# Patient Record
Sex: Male | Born: 1970 | Race: Black or African American | Hispanic: No | Marital: Married | Smoking: Never smoker
Health system: Southern US, Community
[De-identification: ages and names within clinical notes are randomized; demographics above are authoritative.]

## PROBLEM LIST (undated history)

## (undated) DIAGNOSIS — N189 Chronic kidney disease, unspecified: Secondary | ICD-10-CM

## (undated) DIAGNOSIS — I10 Essential (primary) hypertension: Secondary | ICD-10-CM

## (undated) HISTORY — PX: PROSTATECTOMY: SHX69

---

## 2019-03-23 ENCOUNTER — Emergency Department (HOSPITAL_BASED_OUTPATIENT_CLINIC_OR_DEPARTMENT_OTHER)
Admission: EM | Admit: 2019-03-23 | Discharge: 2019-03-23 | Disposition: A | Discharge status: Home / Self Care | Payer: 59 | Attending: Emergency Medicine | Admitting: Emergency Medicine

## 2019-03-23 ENCOUNTER — Encounter (HOSPITAL_BASED_OUTPATIENT_CLINIC_OR_DEPARTMENT_OTHER): Payer: Self-pay

## 2019-03-23 ENCOUNTER — Other Ambulatory Visit: Payer: Self-pay

## 2019-03-23 ENCOUNTER — Emergency Department (HOSPITAL_BASED_OUTPATIENT_CLINIC_OR_DEPARTMENT_OTHER): Payer: 59

## 2019-03-23 DIAGNOSIS — I10 Essential (primary) hypertension: Secondary | ICD-10-CM | POA: Diagnosis not present

## 2019-03-23 DIAGNOSIS — R002 Palpitations: Secondary | ICD-10-CM | POA: Insufficient documentation

## 2019-03-23 DIAGNOSIS — Z20828 Contact with and (suspected) exposure to other viral communicable diseases: Secondary | ICD-10-CM | POA: Insufficient documentation

## 2019-03-23 DIAGNOSIS — R0789 Other chest pain: Secondary | ICD-10-CM | POA: Diagnosis present

## 2019-03-23 DIAGNOSIS — R42 Dizziness and giddiness: Secondary | ICD-10-CM

## 2019-03-23 LAB — CBC WITH DIFFERENTIAL/PLATELET
Abs Immature Granulocytes: 0.03 10*3/uL (ref 0.00–0.07)
Basophils Absolute: 0.1 10*3/uL (ref 0.0–0.1)
Basophils Relative: 1 %
Eosinophils Absolute: 0.2 10*3/uL (ref 0.0–0.5)
Eosinophils Relative: 2 %
HCT: 52.3 % — ABNORMAL HIGH (ref 39.0–52.0)
Hemoglobin: 17 g/dL (ref 13.0–17.0)
Immature Granulocytes: 0 %
Lymphocytes Relative: 24 %
Lymphs Abs: 2.6 10*3/uL (ref 0.7–4.0)
MCH: 31.7 pg (ref 26.0–34.0)
MCHC: 32.5 g/dL (ref 30.0–36.0)
MCV: 97.6 fL (ref 80.0–100.0)
Monocytes Absolute: 1.3 10*3/uL — ABNORMAL HIGH (ref 0.1–1.0)
Monocytes Relative: 12 %
Neutro Abs: 6.5 10*3/uL (ref 1.7–7.7)
Neutrophils Relative %: 61 %
Platelets: 344 10*3/uL (ref 150–400)
RBC: 5.36 MIL/uL (ref 4.22–5.81)
RDW: 11.8 % (ref 11.5–15.5)
WBC: 10.7 10*3/uL — ABNORMAL HIGH (ref 4.0–10.5)
nRBC: 0 % (ref 0.0–0.2)

## 2019-03-23 LAB — MAGNESIUM: Magnesium: 2 mg/dL (ref 1.7–2.4)

## 2019-03-23 LAB — BASIC METABOLIC PANEL
Anion gap: 12 (ref 5–15)
BUN: 11 mg/dL (ref 6–20)
CO2: 24 mmol/L (ref 22–32)
Calcium: 9.2 mg/dL (ref 8.9–10.3)
Chloride: 104 mmol/L (ref 98–111)
Creatinine, Ser: 0.98 mg/dL (ref 0.61–1.24)
GFR calc Af Amer: >60 mL/min (ref >60)
GFR calc non Af Amer: >60 mL/min (ref >60)
Glucose, Bld: 126 mg/dL — ABNORMAL HIGH (ref 70–99)
Potassium: 3.2 mmol/L — ABNORMAL LOW (ref 3.5–5.1)
Sodium: 140 mmol/L (ref 135–145)

## 2019-03-23 LAB — TROPONIN I (HIGH SENSITIVITY): Troponin I (High Sensitivity): 8 ng/L (ref <18)

## 2019-03-23 MED ORDER — LISINOPRIL 10 MG PO TABS: 10.0000 mg | ORAL_TABLET | Freq: Every day | ORAL | 0 refills | Status: DC

## 2019-03-23 MED ORDER — AMLODIPINE BESYLATE 10 MG PO TABS: 10.0000 mg | ORAL_TABLET | Freq: Every day | ORAL | 0 refills | Status: AC

## 2019-03-23 MED ORDER — AMLODIPINE BESYLATE 10 MG PO TABS: 10.0000 mg | ORAL_TABLET | Freq: Every day | ORAL | 0 refills | Status: DC

## 2019-03-23 NOTE — Discharge Instructions (Signed)
Please return for worsening or persistent symptoms.  Please call the office upstairs, or call the hotline number or ask family and friends to try and establish with a family physician.

## 2019-03-23 NOTE — ED Provider Notes (Addendum)
[facility] EMERGENCY DEPARTMENT Provider Note   CSN: [id number] Arrival date & time: 03/23/19  5638     History Chief Complaint  Patient presents with  . Dizziness    Timothy Rosario is a 48 y.o. male.  48 yo M with a chief complaints of palpitations chest pressure.  This occurred a couple times during the night.  He felt slightly lightheaded with it as well.  Lasted for a couple minutes and resolved.  Denied any symptoms otherwise.  Denied chest pain or shortness of breath over the past few days denies headache or neck pain.  Denies unilateral numbness or weakness denies difficulty with speech or swallowing.  Denies head trauma.  History of hypertension used to be on lisinopril he thinks 10 mg.  The history is provided by the patient.  Illness Severity:  Moderate Onset quality:  Gradual Duration:  6 hours Timing:  Intermittent Progression:  Resolved Chronicity:  New Associated symptoms: chest pain and shortness of breath   Associated symptoms: no abdominal pain, no congestion, no diarrhea, no fever, no headaches, no myalgias, no rash and no vomiting        History reviewed. No pertinent past medical history.  There are no problems to display for this patient.   History reviewed. No pertinent surgical history.     No family history on file.  Social History   Tobacco Use  . Smoking status: Never Smoker  Substance Use Topics  . Alcohol use: Never  . Drug use: Never    Home Medications Prior to Admission medications   Medication Sig Start Date End Date Taking? Authorizing Provider  amLODipine (NORVASC) 10 MG tablet Take 1 tablet (10 mg total) by mouth daily. 03/23/19   Deno Etienne, DO  lisinopril (ZESTRIL) 10 MG tablet Take 1 tablet (10 mg total) by mouth daily. 03/23/19 03/23/19  Deno Etienne, DO    Allergies    Patient has no allergy information on record.  Review of Systems   Review of Systems  Constitutional: Negative for chills and fever.    HENT: Negative for congestion and facial swelling.   Eyes: Negative for discharge and visual disturbance.  Respiratory: Positive for shortness of breath.   Cardiovascular: Positive for chest pain and palpitations.  Gastrointestinal: Negative for abdominal pain, diarrhea and vomiting.  Musculoskeletal: Negative for arthralgias and myalgias.  Skin: Negative for color change and rash.  Neurological: Negative for tremors, syncope and headaches.  Psychiatric/Behavioral: Negative for confusion and dysphoric mood.    Physical Exam Updated Vital Signs BP (!) 191/118   Pulse 79   Temp 97.8 F (36.6 C) (Oral)   Resp (!) 24   Ht 5\' 11"  (1.803 m)   Wt 131.5 kg   SpO2 96%   BMI 40.45 kg/m   Physical Exam Vitals and nursing note reviewed.  Constitutional:      Appearance: He is well-developed. He is obese.  HENT:     Head: Normocephalic and atraumatic.  Eyes:     Pupils: Pupils are equal, round, and reactive to light.  Neck:     Vascular: No JVD.  Cardiovascular:     Rate and Rhythm: Normal rate and regular rhythm.     Heart sounds: No murmur. No friction rub. No gallop.   Pulmonary:     Effort: No respiratory distress.     Breath sounds: No wheezing.  Abdominal:     General: There is no distension.     Tenderness: There is no guarding or  rebound.  Musculoskeletal:        General: Normal range of motion.     Cervical back: Normal range of motion and neck supple.  Skin:    Coloration: Skin is not pale.     Findings: No rash.  Neurological:     Mental Status: He is alert and oriented to person, place, and time.     GCS: GCS eye subscore is 4. GCS verbal subscore is 5. GCS motor subscore is 6.     Cranial Nerves: Cranial nerves are intact.     Sensory: Sensation is intact.     Motor: Motor function is intact.     Coordination: Coordination is intact.     Gait: Gait is intact.     Comments: Benign neuro exam  Psychiatric:        Behavior: Behavior normal.     ED Results  / Procedures / Treatments   Labs (all labs ordered are listed, but only abnormal results are displayed) Labs Reviewed  CBC WITH DIFFERENTIAL/PLATELET - Abnormal; Notable for the following components:      Result Value   WBC 10.7 (*)    HCT 52.3 (*)    Monocytes Absolute 1.3 (*)    All other components within normal limits  BASIC METABOLIC PANEL - Abnormal; Notable for the following components:   Potassium 3.2 (*)    Glucose, Bld 126 (*)    All other components within normal limits  NOVEL CORONAVIRUS, NAA (HOSP ORDER, SEND-OUT TO REF LAB; TAT 18-24 HRS)  MAGNESIUM  TROPONIN I (HIGH SENSITIVITY)    EKG EKG Interpretation  Date/Time:  Sunday March 23 2019 06:33:03 EST Ventricular Rate:  96 PR Interval:    QRS Duration: 86 QT Interval:  360 QTC Calculation: 455 R Axis:   27 Text Interpretation: Sinus rhythm No old tracing to compare Confirmed by Ward, Kristen ([id number]) on 03/23/2019 6:39:25 AM   Radiology DG Chest Portable 1 View  Result Date: 03/23/2019 CLINICAL DATA:  48 year old male with shortness of breath dizziness and palpitations since 1800 hours. Hypertensive, 243 systolic. EXAM: PORTABLE CHEST 1 VIEW COMPARISON:  None. FINDINGS: Portable AP upright view at 0659 hours. Mild-to-moderate elevation of the right hemidiaphragm may be normal variant. Mildly low lung volumes overall. Mediastinal contours are within normal limits allowing for portable technique. Visualized tracheal air column is within normal limits. Nonspecific bilateral increased pulmonary interstitial opacity, not resembling pulmonary edema and no pleural fluid or effusion is identified. No pneumothorax or consolidation. No acute osseous abnormality identified. IMPRESSION: Somewhat low lung volumes with nonspecific bilateral increased pulmonary interstitial opacities, more resembling chronic lung disease or a viral/atypical infection than pulmonary edema. No pleural effusion or other acute finding identified.  Electronically Signed   By: H  Hall M.D.   On: 03/23/2019 07:16    Procedures Procedures (including critical care time)  Medications Ordered in ED Medications - No data to display  ED Course  I have reviewed the triage vital signs and the nursing notes.  Pertinent labs & imaging results that were available during my care of the patient were reviewed by me and considered in my medical decision making (see chart for details).    MDM Rules/Calculators/A&P                      48  yo M with a chief complaints of palpitations.  Lasted for a couple minutes and happened twice over the evening.  Now is resolved.  He denies any  current symptoms.  He did have some chest pressure and shortness of breath and lightheadedness when they occurred.  I am unsure of the exact etiology of his palpitations.  He is asymptomatic and was watched on the monitor for an hour without any arrhythmias.  He has no electrolyte abnormality he is not anemic.  He does have a mild leukocytosis and the chest x-ray appears to have an atypical viral process.  As this is occurring during the novel coronavirus pandemic will send a outpatient Covid test.  Have him self isolate at home.  He is hypertensive here though he had some values that are in the system that I think are inaccurate.  His blood pressure cuff when I came to evaluate him was on his distal wrist.  When I placed it on his upper arm his blood pressure was 185/80.  I will start him on lisinopril dose that he thinks he was on previously.  The patient later notified me that his wife had checked and he actually was on amlodipine and not lisinopril.  We will change the prescription.  Timothy Rosario was evaluated in Emergency Department on 03/23/2019 for the symptoms described in the history of present illness. He/she was evaluated in the context of the global COVID-19 pandemic, which necessitated consideration that the patient might be at risk for infection with the SARS-CoV-2  virus that causes COVID-19. Institutional protocols and algorithms that pertain to the evaluation of patients at risk for COVID-19 are in a state of rapid change based on information released by regulatory bodies including the CDC and federal and state organizations. These policies and algorithms were followed during the patient's care in the ED.   7:45 AM:  I have discussed the diagnosis/risks/treatment options with the patient and believe the pt to be eligible for discharge home to follow-up with PCP. We also discussed returning to the ED immediately if new or worsening sx occur. We discussed the sx which are most concerning (e.g., sudden worsening pain, fever, inability to tolerate by mouth, recurrent and persistent symptoms) that necessitate immediate return. Medications administered to the patient during their visit and any new prescriptions provided to the patient are listed below.  Medications given during this visit Medications - No data to display   The patient appears reasonably screen and/or stabilized for discharge and I doubt any other medical condition or other [facility] requiring further screening, evaluation, or treatment in the ED at this time prior to discharge.   Final Clinical Impression(s) / ED Diagnoses Final diagnoses:  Palpitations  Dizziness  Essential hypertension    Rx / DC Orders ED Discharge Orders         Ordered    lisinopril (ZESTRIL) 10 MG tablet  Daily,   Status:  Discontinued     03/23/19 0737    amLODipine (NORVASC) 10 MG tablet  Daily     03/23/19 0744           Melene PlanFloyd, Maytal Mijangos, DO 03/23/19 0742    Melene PlanFloyd, Leani Myron, DO 03/23/19 [phone]

## 2019-03-23 NOTE — ED Triage Notes (Signed)
Pt presents with c/o dizziness and heart palpitations since 6pm last night. Pt denies chest pain/SOB. Pt noted to be hypertensive during triage- 243/129. Pt denies BP medication. Pt a/o.

## 2019-03-23 NOTE — ED Provider Notes (Signed)
MSE was initiated and I personally evaluated the patient and placed orders (if any) at  6:43 AM on March 23, 2019.  The patient appears stable so that the remainder of the MSE may be completed by another provider.   Patient is a 48 year old male with history of obesity, hypertension here presents emergency department palpitations feeling like "my heart is fluttering", shortness of breath and lightheadedness.  No vertigo.  No chest tightness or pressure.  Very hypertensive here.  Reports it has been years since his blood pressure has been checked.  He was on lisinopril previously but has not been taking it for 2 years.  Does not remember his dose.   Patient has clear speech and normal movement of all extremities.  Able to ambulate back to his ED room without difficulty.  Heart rate in the low 100s.  Not in distress at this time.  Patient is stable and will be seen by morning ED physician.  Labs, x-ray ordered.  EKG shows no ischemic abnormality, arrhythmia.    EKG Interpretation  Date/Time:  Sunday March 23 2019 06:33:03 EST Ventricular Rate:  96 PR Interval:    QRS Duration: 86 QT Interval:  360 QTC Calculation: 455 R Axis:   27 Text Interpretation: Sinus rhythm No old tracing to compare Confirmed by Azriel Jakob, Cyril Mourning [phone]) on 03/23/2019 6:39:25 AM         Jaxten Brosh, Delice Bison, DO 03/23/19 [phone]

## 2019-03-24 LAB — NOVEL CORONAVIRUS, NAA (HOSP ORDER, SEND-OUT TO REF LAB; TAT 18-24 HRS): SARS-CoV-2, NAA: NOT DETECTED

## 2019-03-25 ENCOUNTER — Emergency Department (HOSPITAL_BASED_OUTPATIENT_CLINIC_OR_DEPARTMENT_OTHER)
Admission: EM | Admit: 2019-03-25 | Discharge: 2019-03-25 | Disposition: A | Discharge status: Home / Self Care | Payer: 59 | Attending: Emergency Medicine | Admitting: Emergency Medicine

## 2019-03-25 ENCOUNTER — Other Ambulatory Visit: Payer: Self-pay

## 2019-03-25 ENCOUNTER — Encounter (HOSPITAL_BASED_OUTPATIENT_CLINIC_OR_DEPARTMENT_OTHER): Payer: Self-pay | Admitting: Emergency Medicine

## 2019-03-25 DIAGNOSIS — R002 Palpitations: Secondary | ICD-10-CM | POA: Diagnosis not present

## 2019-03-25 DIAGNOSIS — F1010 Alcohol abuse, uncomplicated: Secondary | ICD-10-CM

## 2019-03-25 DIAGNOSIS — R42 Dizziness and giddiness: Secondary | ICD-10-CM | POA: Diagnosis not present

## 2019-03-25 MED ORDER — LORAZEPAM 1 MG PO TABS: ORAL_TABLET | ORAL | 0 refills | Status: AC

## 2019-03-25 NOTE — Discharge Instructions (Signed)
Substance Abuse Treatment Programs ° °Intensive Outpatient Programs °[facility]     °[address]      °[place], [place]                   °[phone]      ° °[facility] °[address] °[place], [place] °[phone] ° °[facility]     °(Inpatient and outpatient)     °[address]           °[phone]   ° °[facility] °[phone] (Suboxone and Methadone) ° °[address]      °[place], [place] [address]      °336-882-2125      ° °[address] °[place], [place] °852-3033 ° °[facility] (Outpatient/Inpatient, [facility])    °(insurance only) [phone]      °       °[facility] (Groups & Residential) °[place], [place] °[phone] ° °   °[facility]     °[address]     °[place], [place]      °[phone]      ° °[facility] (for caregivers and family) °[address] °[place], [place] °336-299-4655 ° ° ° ° ° °Residential Treatment Programs °[facility]      °[address], [place], [place] [address]  °[phone]      ° °[facility] °[address] [place], [place] [address] °[phone] ° °[facility]        °336-248-8914      ° °[facility] °1-800-659-3381 ° °[facility]             °[address]                                         °[place], [place]                                                °877-615-2722 or 336-784-9470                              ° °[facility] [place] °[address] °[place], [address] °1.877.941.8954 ° °[facility]    °[address]      °[place], [place]     °[phone]      ° °[facility] °[address] °[place], [place] °336-285-9073 ° °[facility]   °[address]     °[place], [place] [address]     °[phone]      °Admissions: 8am-3pm M-F ° °[facility] °[address] °[place],  [place] °336-227-7417 ° °[facility]: Residential Program (90 Days)   °[place], [place]      °336-725-8389 or [phone]    ° °[facility] °[place], [place] °(Walk in Hours over the weekend or by referral) ° °[facility] °[address], [place],  [address] °([phone] ° °C[facility]is Mobile: [facility]:  1-877-626-1772 (for crisis response 24 hours a day) °[facility] Hotline:      1-800-256-2452 °Outpatient Psychiatry and Counseling ° °[facility]: Mobile Crisis   Management 24 hours:  1-877-626-1772 ° °[facility] sliding scale fee and walk in schedule: M-F 8am-12pm/1pm-3pm °[address]  °[place], [place] [address] °[phone] ° °[facility] °[address] °[place], [place] [address] °336-703-9650 ° °[facility] ([facility])- new patient walk-in appointments available Monday - Friday 8am -3pm.          °[address] °[place], [place] [address] °336-676-6840 or crisis line- 336-676-6905 ° °[facility] Intensive Outpatient Therapy Program °[address] °[place], [place] [address] °336-832-9804 ° °[facility]                  °Crisis Services      °[phone]      °[address]     °[place], [place] [address]                ° °[facility]   °[facility] °[phone] °[address] °[place], [place] [address] ° ° °[facility]          °[address]  °[place], [place] [address]       °([phone] ° °[facility] °[address] °[place], [place] [address] °336-292-1510 ° °[facility]    °[address]    °[place], [place] [address]     °[phone]      ° °Parish McKinney, [place]     °[address]     °[place] [address]     °336-282-1251     °  °[facility] °[address] °[place] [address] ° °[facility]     °[address]     °[place], [place]      °336-542-2076      ° °[facility] °Shamsher Ahluwalia, MD °[address] °[place], [place] [address] °[phone] ° °[facility]     °[address]     °[place], [place] [address]     °336-274-1237      ° °[facility] °[address] °[place], [place] [address] °336-854-4450 °Resources for Temporary Residential Assistance/Crisis Centers ° °DAY CENTERS °[facility] °M-F 8am-3pm   °[address] [place], [place] [address]   336-332-0824 °Services include: laundry, barbering, support groups, case management, phone  & computer access, showers, AA/NA mtgs, mental health/substance abuse nurse, job skills class, disability information, VA assistance, spiritual classes, etc.  ° °HOMELESS SHELTERS ° °[facility]     °[facility]   °[address], [place]     °[phone]       °       °[facility] (women and children)       °[address] °[place], [place] [address] °336-275-0820 °[email or web address] for application and process °Application Required ° °[facility]   °[address]    °[place] [address]     °[phone]       °             °[facility] °[address] °[place], [place] [address] °336.273.5572 °336-235-0363(schedule application appt.) °Application Required ° °[facility] (women only)    °[address]     °[place],  [address]     °336-884-1039      °  Intake starts 6pm daily °Need valid ID, SSC, & Police report °[facility] [place] °[address] °[place], [place] °336-881-5420 °Application Required ° °[facility] (men only)     °[address]      °[place], [place]     °[phone]      ° °[facility] °(Pregnant women only) °[address] °[place], [place] °336-275-0206 ° °[facility]      °[address]      °[place], [place] [address]     °336-722-9951      °       °[facility] °[address] °[place], [place] °336-723-1848 °90 day commitment/SA/Application process ° °[facility](men only)     °[address]     °[place], [place]     °[phone]       °Check-in at 7pm     °       °[facility] °[address] °[place], [place] [address] °336-248-6684 °Men/Women/Women and Children must be there by 7 pm ° °[facility] °[place], [place] °336-722-8721                ° °

## 2019-03-25 NOTE — ED Triage Notes (Signed)
Pt is /co dizziness, feeling light headed and feels like his heart is beating unevenly  Pt states it started about an hour ago  Pt was seen here on the 27th for same

## 2019-03-25 NOTE — ED Provider Notes (Signed)
[facility] EMERGENCY DEPARTMENT Provider Note   CSN: [id number] Arrival date & time: 03/25/19  0116     History Chief Complaint  Patient presents with  . Palpitations  . Dizziness    Timothy Rosario is a 48 y.o. male.  The history is provided by the patient.  Palpitations Palpitations quality:  Fast Onset quality:  Sudden Timing:  Constant Progression:  Improving Chronicity:  New Relieved by:  None tried Worsened by:  Nothing Associated symptoms: dizziness and shortness of breath   Associated symptoms: no chest pain, no diaphoresis, no lower extremity edema, no syncope and no vomiting   Risk factors: no heart disease   Dizziness Associated symptoms: palpitations and shortness of breath   Associated symptoms: no chest pain, no syncope and no vomiting    Patient presents with episode of palpitations.  He reports it woke him up tonight.  He reports it lasted less than half hour and improves after he wakes up and walks around.  No fevers or vomiting.  He did feel short of breath, but had no active chest pain Patient reports he was seen in the ER recently and was diagnosed with hypertension and started blood pressure medicines.  He has been taking the medicines.  He reports he felt well after being discharged, but the symptoms came back tonight  Patient notes to being under stress at home.  He also reports he drinks a pint every night.  He reports he drinks liquor every night for the past 6 months.  He reports that he has not had alcohol since his last ER visit on 12/27.  He denies any drug abuse.  He reports that he seems to sleep better when he drinks.  He also reports that he wakes up frequently at night usually with difficulty breathing but this is not new and his wife tells him that he snores frequently  he reports his last episode of palpitations occurred while sleeping.  He is now feeling improved     PMH-Hypertension Social history - drinks pint of liquor  daily Family History  Problem Relation Age of Onset  . Hypertension Other     Social History   Tobacco Use  . Smoking status: Never Smoker  . Smokeless tobacco: Never Used  Substance Use Topics  . Alcohol use: Yes    Comment: occ  . Drug use: Never    Home Medications Prior to Admission medications   Medication Sig Start Date End Date Taking? Authorizing Provider  amLODipine (NORVASC) 10 MG tablet Take 1 tablet (10 mg total) by mouth daily. 03/23/19   Melene Plan, DO  LORazepam (ATIVAN) 1 MG tablet Take 3 tablets PO on day one.  Take 2 tablets PO on day 2.  Take one tablet on day 3 then STOP 03/25/19   Zadie Rhine, MD  lisinopril (ZESTRIL) 10 MG tablet Take 1 tablet (10 mg total) by mouth daily. 03/23/19 03/23/19  Melene Plan, DO    Allergies    Patient has no allergy information on record.  Review of Systems   Review of Systems  Constitutional: Negative for diaphoresis.  Respiratory: Positive for shortness of breath.   Cardiovascular: Positive for palpitations. Negative for chest pain and syncope.  Gastrointestinal: Negative for vomiting.  Neurological: Positive for dizziness. Negative for syncope.  All other systems reviewed and are negative.   Physical Exam Updated Vital Signs BP (!) 152/100 (BP Location: Right Arm)   Pulse 77   Temp 98.5 F (36.9 C) (Oral)  Resp 20   Ht 1.803 m (5\' 11" )   Wt 131.5 kg   SpO2 96%   BMI 40.45 kg/m   Physical Exam CONSTITUTIONAL: Well developed/well nourished HEAD: Normocephalic/atraumatic EYES: EOMI/PERRL NECK: supple no meningeal signs SPINE/BACK:entire spine nontender CV: S1/S2 noted, no murmurs/rubs/gallops noted LUNGS: Lungs are clear to auscultation bilaterally, no apparent distress ABDOMEN: soft, nontender, no rebound or guarding, bowel sounds noted throughout abdomen, obese GU:no cva tenderness NEURO: Pt is awake/alert/appropriate, moves all extremitiesx4.  No facial droop.  No arm or leg drift, no  tremor EXTREMITIES: pulses normal/equalx4, full ROM SKIN: warm, color normal PSYCH: no abnormalities of mood noted, alert and oriented to situation  ED Results / Procedures / Treatments   Labs (all labs ordered are listed, but only abnormal results are displayed) Labs Reviewed - No data to display  EKG EKG Interpretation  Date/Time:  Tuesday March 25 2019 01:45:59 EST Ventricular Rate:  91 PR Interval:    QRS Duration: 95 QT Interval:  383 QTC Calculation: 472 R Axis:   34 Text Interpretation: Sinus rhythm Borderline T wave abnormalities No significant change since last tracing Confirmed by Ripley Fraise [phone]) on 03/25/2019 4:14:52 AM   Radiology No results found.  Procedures Procedures  Medications Ordered in ED Medications - No data to display  ED Course  I have reviewed the triage vital signs and the nursing notes.     MDM Rules/Calculators/A&P                      Patient presents for repeat ER visit for palpitations.  He was seen on December 27 for similar presentation and had extensive work-up including negative Covid testing.  Labs at that time including troponin were negative.  I suspect this is combination of anxiety, sleep apnea as well as mild alcohol withdrawal.  Patient admits to drinking EtOH about every night for several months and stopped about 2 days ago.  The symptoms could be mild withdrawal.  He agrees to start a short Ativan taper.  He will try to cut back or limit alcohol.  Suspect given his obesity as well as history of snoring, he likely has sleep apnea.  He admits that these episodes only seem to happen at night which could indicate anxiety.  No signs of dysrhythmia on EKG.  He is very well-appearing at this time.  Low suspicion for ACS/PE/dissection.  No signs of any acute neurologic emergency.  Advised patient that he must have follow-up with PCP soon so he can undergo sleep study and get better control with blood pressure Outpatient  resources given to patient if he chooses to seek help for his alcohol abuse Final Clinical Impression(s) / ED Diagnoses Final diagnoses:  Palpitations  Alcohol abuse    Rx / DC Orders ED Discharge Orders         Ordered    LORazepam (ATIVAN) 1 MG tablet     03/25/19 0523           Ripley Fraise, MD 03/25/19 0745

## 2019-03-25 NOTE — Progress Notes (Addendum)
Patient ambulated around the department without any complications. Patient SPO2 stayed between 95% to 96% the whole time. Patient did not complain of any SOB after the walk.

## 2021-04-14 ENCOUNTER — Encounter (HOSPITAL_BASED_OUTPATIENT_CLINIC_OR_DEPARTMENT_OTHER): Payer: Self-pay | Admitting: *Deleted

## 2021-04-14 ENCOUNTER — Other Ambulatory Visit: Payer: Self-pay

## 2021-04-14 ENCOUNTER — Emergency Department (HOSPITAL_BASED_OUTPATIENT_CLINIC_OR_DEPARTMENT_OTHER): Payer: 59

## 2021-04-14 ENCOUNTER — Emergency Department (HOSPITAL_BASED_OUTPATIENT_CLINIC_OR_DEPARTMENT_OTHER)
Admission: EM | Admit: 2021-04-14 | Discharge: 2021-04-14 | Disposition: A | Discharge status: Home / Self Care | Payer: 59 | Attending: Emergency Medicine | Admitting: Emergency Medicine

## 2021-04-14 DIAGNOSIS — Z79899 Other long term (current) drug therapy: Secondary | ICD-10-CM | POA: Insufficient documentation

## 2021-04-14 DIAGNOSIS — M25571 Pain in right ankle and joints of right foot: Secondary | ICD-10-CM | POA: Insufficient documentation

## 2021-04-14 HISTORY — DX: Chronic kidney disease, unspecified: N18.9

## 2021-04-14 HISTORY — DX: Essential (primary) hypertension: I10

## 2021-04-14 NOTE — Discharge Instructions (Signed)
Try to keep your weight off of it.  Follow-up with your family doctor in the office.  Please return for redness fever.  Take 4 over the counter ibuprofen tablets 3 times a day or 2 over-the-counter naproxen tablets twice a day for pain. Also take tylenol 1000mg (2 extra strength) four times a day.

## 2021-04-14 NOTE — ED Triage Notes (Signed)
Here for evaluation for rt ankle pain, began having rt knee pain earlier this week. Denies any injury to rt ankle. States he is unable to place weight on rt foot.

## 2021-04-14 NOTE — ED Provider Notes (Signed)
[facility] EMERGENCY DEPARTMENT Provider Note   CSN: [id number] Arrival date & time: 04/14/21  1029     History  Chief Complaint  Patient presents with   Ankle Pain    Manvik Kozloff is a 51 y.o. male.  51 yo M with a chief complaint of right ankle pain.  This is been going on for a few days.  Denies any injury to the area.  Is got to the point where he has trouble bearing any weight.  No fevers or chills.  The history is provided by the patient.  Ankle Pain Location:  Ankle Time since incident:  2 days Injury: no   Ankle location:  R ankle Pain details:    Quality:  Aching   Radiates to:  Does not radiate   Severity:  Moderate   Onset quality:  Gradual   Duration:  2 days   Timing:  Constant   Progression:  Worsening Chronicity:  New Dislocation: no   Prior injury to area:  No Relieved by:  Nothing Worsened by:  Nothing Ineffective treatments:  None tried Associated symptoms: no fever       Home Medications Prior to Admission medications   Medication Sig Start Date End Date Taking? Authorizing Provider  amLODipine (NORVASC) 10 MG tablet Take 1 tablet (10 mg total) by mouth daily. 03/23/19  Yes Deno Etienne, DO  chlorthalidone (HYGROTON) 50 MG tablet Take 50 mg by mouth daily. 02/12/21   [provider]  LORazepam (ATIVAN) 1 MG tablet Take 3 tablets PO on day one.  Take 2 tablets PO on day 2.  Take one tablet on day 3 then STOP 03/25/19   Ripley Fraise, MD  rosuvastatin (CRESTOR) 10 MG tablet Take 10 mg by mouth at bedtime. 03/28/21   [provider]  lisinopril (ZESTRIL) 10 MG tablet Take 1 tablet (10 mg total) by mouth daily. 03/23/19 03/23/19  Deno Etienne, DO      Allergies    Patient has no known allergies.    Review of Systems   Review of Systems  Constitutional:  Negative for chills and fever.  HENT:  Negative for congestion and facial swelling.   Eyes:  Negative for discharge and visual disturbance.  Respiratory:  Negative  for shortness of breath.   Cardiovascular:  Negative for chest pain and palpitations.  Gastrointestinal:  Negative for abdominal pain, diarrhea and vomiting.  Musculoskeletal:  Positive for arthralgias. Negative for myalgias.  Skin:  Negative for color change and rash.  Neurological:  Negative for tremors, syncope and headaches.  Psychiatric/Behavioral:  Negative for confusion and dysphoric mood.    Physical Exam Updated Vital Signs BP (!) 141/79 (BP Location: Right Arm)    Pulse (!) 104    Temp 99.5 F (37.5 C) (Oral)    Resp 20    Ht 5\' 11"  (1.803 m)    Wt 127 kg    SpO2 98%    BMI 39.05 kg/m  Physical Exam Vitals and nursing note reviewed.  Constitutional:      Appearance: He is well-developed.  HENT:     Head: Normocephalic and atraumatic.  Eyes:     Pupils: Pupils are equal, round, and reactive to light.  Neck:     Vascular: No JVD.  Cardiovascular:     Rate and Rhythm: Normal rate and regular rhythm.     Heart sounds: No murmur heard.   No friction rub. No gallop.  Pulmonary:     Effort: No respiratory  distress.     Breath sounds: No wheezing.  Abdominal:     General: There is no distension.     Tenderness: There is no abdominal tenderness. There is no guarding or rebound.  Musculoskeletal:        General: Swelling and tenderness present. Normal range of motion.     Cervical back: Normal range of motion and neck supple.     Comments: Pain and swelling to the right ankle mostly along the deltoid ligament.  Some warmth to the medial aspect of the leg.  I am able to range the ankle without any significant discomfort.  Pulse motor and sensation intact.  Skin:    Coloration: Skin is not pale.     Findings: No rash.  Neurological:     Mental Status: He is alert and oriented to person, place, and time.  Psychiatric:        Behavior: Behavior normal.    ED Results / Procedures / Treatments   Labs (all labs ordered are listed, but only abnormal results are  displayed) Labs Reviewed - No data to display  EKG None  Radiology DG Ankle Complete Right  Result Date: 04/14/2021 CLINICAL DATA:  Ankle pain and swelling EXAM: RIGHT ANKLE - COMPLETE 3+ VIEW COMPARISON:  None. FINDINGS: No evidence of acute fracture or dislocation. There is evidence of old medial ligamentous injuries with corticated calcifications inferior to the medial malleolus. Articular surfaces of the ankle joint appear normal. Tiny bone density posterior to the ankle joint which could be an ossicle, but a small intra-articular loose body is not excluded. Small corticated density just superior to the talonavicular region is probably subsequent to a distant injury. IMPRESSION: No acute fracture. Evidence of distant medial ligamentous injury with well corticated calcifications. Small calcification posterior to the ankle joint on the lateral view could be an ossicle or possibly an intra-articular loose body. Electronically Signed   By: Nelson Chimes M.D.   On: 04/14/2021 11:10    Procedures Procedures    Medications Ordered in ED Medications - No data to display  ED Course/ Medical Decision Making/ A&P                           Medical Decision Making Amount and/or Complexity of Data Reviewed Radiology: ordered.   51 yo M with a chief complaints of right ankle pain.  Mostly along the medial aspect of the ankle.  Pain is more along the attachment to the ligaments to the medial malleolus than anywhere else.  May be subclinical strain, he is having quite a bit of edema.  Seems less likely to be septic arthritis with me being able to range the ankle.  Other possibility is overlying cellulitis.  His leg is somewhat erythematous though the color seems similar to the other side though more warm on the right than the left.  Seems less likely to be DVT.  X-ray viewed by me without fracture.  No obvious joint effusion.  Radiology read with signs of chronic injury.  I discussed this with the  patient who told me that he did used to have trouble with his ankle especially playing basketball he would have recurrent sprains.  I discussed risk and benefits of arthrocentesis with the patient and currently declining.  We will place in an ASO.  Crutches.  PCP follow-up.  11:19 AM:  I have discussed the diagnosis/risks/treatment options with the patient and believe the pt to be  eligible for discharge home to follow-up with PCP. We also discussed returning to the ED immediately if new or worsening sx occur. We discussed the sx which are most concerning (e.g., sudden worsening pain, fever, inability to tolerate by mouth) that necessitate immediate return. Medications administered to the patient during their visit and any new prescriptions provided to the patient are listed below.  Medications given during this visit Medications - No data to display   The patient appears reasonably screen and/or stabilized for discharge and I doubt any other medical condition or other [facility] requiring further screening, evaluation, or treatment in the ED at this time prior to discharge.         Final Clinical Impression(s) / ED Diagnoses Final diagnoses:  Acute right ankle pain    Rx / DC Orders ED Discharge Orders     None         Deno Etienne, DO 04/14/21 1119

## 2022-03-28 ENCOUNTER — Encounter (HOSPITAL_BASED_OUTPATIENT_CLINIC_OR_DEPARTMENT_OTHER): Payer: Self-pay

## 2022-03-28 ENCOUNTER — Emergency Department (HOSPITAL_BASED_OUTPATIENT_CLINIC_OR_DEPARTMENT_OTHER): Payer: 59

## 2022-03-28 ENCOUNTER — Other Ambulatory Visit: Payer: Self-pay

## 2022-03-28 ENCOUNTER — Emergency Department (HOSPITAL_BASED_OUTPATIENT_CLINIC_OR_DEPARTMENT_OTHER)
Admission: EM | Admit: 2022-03-28 | Discharge: 2022-03-29 | Disposition: A | Discharge status: Home / Self Care | Payer: 59 | Attending: Emergency Medicine | Admitting: Emergency Medicine

## 2022-03-28 DIAGNOSIS — N189 Chronic kidney disease, unspecified: Secondary | ICD-10-CM | POA: Insufficient documentation

## 2022-03-28 DIAGNOSIS — R310 Gross hematuria: Secondary | ICD-10-CM | POA: Insufficient documentation

## 2022-03-28 DIAGNOSIS — I129 Hypertensive chronic kidney disease with stage 1 through stage 4 chronic kidney disease, or unspecified chronic kidney disease: Secondary | ICD-10-CM | POA: Insufficient documentation

## 2022-03-28 DIAGNOSIS — Z79899 Other long term (current) drug therapy: Secondary | ICD-10-CM | POA: Insufficient documentation

## 2022-03-28 DIAGNOSIS — R319 Hematuria, unspecified: Secondary | ICD-10-CM | POA: Diagnosis present

## 2022-03-28 LAB — CBC WITH DIFFERENTIAL/PLATELET
Abs Immature Granulocytes: 0.02 10*3/uL (ref 0.00–0.07)
Basophils Absolute: 0.1 10*3/uL (ref 0.0–0.1)
Basophils Relative: 1 %
Eosinophils Absolute: 0.3 10*3/uL (ref 0.0–0.5)
Eosinophils Relative: 3 %
HCT: 47.2 % (ref 39.0–52.0)
Hemoglobin: 15.7 g/dL (ref 13.0–17.0)
Immature Granulocytes: 0 %
Lymphocytes Relative: 27 %
Lymphs Abs: 2.5 10*3/uL (ref 0.7–4.0)
MCH: 31 pg (ref 26.0–34.0)
MCHC: 33.3 g/dL (ref 30.0–36.0)
MCV: 93.1 fL (ref 80.0–100.0)
Monocytes Absolute: 1 10*3/uL (ref 0.1–1.0)
Monocytes Relative: 10 %
Neutro Abs: 5.6 10*3/uL (ref 1.7–7.7)
Neutrophils Relative %: 59 %
Platelets: 308 10*3/uL (ref 150–400)
RBC: 5.07 MIL/uL (ref 4.22–5.81)
RDW: 12.4 % (ref 11.5–15.5)
WBC: 9.4 10*3/uL (ref 4.0–10.5)
nRBC: 0 % (ref 0.0–0.2)

## 2022-03-28 LAB — URINALYSIS, ROUTINE W REFLEX MICROSCOPIC
Bilirubin Urine: NEGATIVE
Glucose, UA: NEGATIVE mg/dL
Ketones, ur: NEGATIVE mg/dL
Nitrite: NEGATIVE
Protein, ur: 100 mg/dL — AB
Specific Gravity, Urine: <=1.005 (ref 1.005–1.030)
pH: 6 (ref 5.0–8.0)

## 2022-03-28 LAB — URINALYSIS, MICROSCOPIC (REFLEX)

## 2022-03-28 NOTE — ED Triage Notes (Signed)
Pt reports hematuria onset today at 4:45pm. Denies abd pain or flank pain. Denies any other abnormal symptoms or complaints at this time.

## 2022-03-28 NOTE — ED Provider Notes (Signed)
[facility] EMERGENCY DEPARTMENT Provider Note   CSN: [id number] Arrival date & time: 03/28/22  1748     History  Chief Complaint  Patient presents with   Hematuria    Timothy Rosario is a 52 y.o. male.  On 445 this afternoon patient was at work when he use the restroom.  He states that he looked down and saw blood.  He states that he then drink plenty of fluids and then urinated again.  States that it was a little bit later.  Does not have a before.  Denies having any penile pain or discharge.  Denies dysuria, flank pain, abdominal pain, nausea, fevers   Hematuria       Home Medications Prior to Admission medications   Medication Sig Start Date End Date Taking? Authorizing Provider  amLODipine (NORVASC) 10 MG tablet Take 1 tablet (10 mg total) by mouth daily. 03/23/19   Deno Etienne, DO  chlorthalidone (HYGROTON) 50 MG tablet Take 50 mg by mouth daily. 02/12/21   [provider]  LORazepam (ATIVAN) 1 MG tablet Take 3 tablets PO on day one.  Take 2 tablets PO on day 2.  Take one tablet on day 3 then STOP 03/25/19   Ripley Fraise, MD  rosuvastatin (CRESTOR) 10 MG tablet Take 10 mg by mouth at bedtime. 03/28/21   [provider]  lisinopril (ZESTRIL) 10 MG tablet Take 1 tablet (10 mg total) by mouth daily. 03/23/19 03/23/19  Deno Etienne, DO      Allergies    Patient has no known allergies.    Review of Systems   Review of Systems  Genitourinary:  Positive for hematuria.    Physical Exam Updated Vital Signs BP (!) 151/78   Pulse 80   Temp 98.5 F (36.9 C) (Oral)   Resp 17   Ht 5\' 11"  (1.803 m)   Wt 124.7 kg   SpO2 100%   BMI 38.35 kg/m  Physical Exam  ED Results / Procedures / Treatments   Labs (all labs ordered are listed, but only abnormal results are displayed) Labs Reviewed  URINALYSIS, ROUTINE W REFLEX MICROSCOPIC - Abnormal; Notable for the following components:      Result Value   Color, Urine BROWN (*)    APPearance TURBID  (*)    Hgb urine dipstick LARGE (*)    Protein, ur 100 (*)    Leukocytes,Ua TRACE (*)    All other components within normal limits  URINALYSIS, MICROSCOPIC (REFLEX) - Abnormal; Notable for the following components:   Bacteria, UA FEW (*)    All other components within normal limits    EKG None  Radiology No results found.  Procedures Procedures  {Document cardiac monitor, telemetry assessment procedure when appropriate:1}  Medications Ordered in ED Medications - No data to display  ED Course/ Medical Decision Making/ A&P                           Medical Decision Making Amount and/or Complexity of Data Reviewed Labs: ordered.   ***  {Document critical care time when appropriate:1} {Document review of labs and clinical decision tools ie heart score, Chads2Vasc2 etc:1}  {Document your independent review of radiology images, and any outside records:1} {Document your discussion with family members, caretakers, and with consultants:1} {Document social determinants of health affecting pt's care:1} {Document your decision making why or why not admission, treatments were needed:1} Final Clinical Impression(s) / ED Diagnoses Final diagnoses:  None  Rx / DC Orders ED Discharge Orders     None

## 2022-03-29 DIAGNOSIS — R310 Gross hematuria: Secondary | ICD-10-CM | POA: Diagnosis not present

## 2022-03-29 LAB — COMPREHENSIVE METABOLIC PANEL
ALT: 18 U/L (ref 0–44)
AST: 24 U/L (ref 15–41)
Albumin: 3.8 g/dL (ref 3.5–5.0)
Alkaline Phosphatase: 54 U/L (ref 38–126)
Anion gap: 11 (ref 5–15)
BUN: 14 mg/dL (ref 6–20)
CO2: 28 mmol/L (ref 22–32)
Calcium: 8.3 mg/dL — ABNORMAL LOW (ref 8.9–10.3)
Chloride: 97 mmol/L — ABNORMAL LOW (ref 98–111)
Creatinine, Ser: 1.19 mg/dL (ref 0.61–1.24)
GFR, Estimated: >60 mL/min (ref >60)
Glucose, Bld: 96 mg/dL (ref 70–99)
Potassium: 2.7 mmol/L — CL (ref 3.5–5.1)
Sodium: 136 mmol/L (ref 135–145)
Total Bilirubin: 1.3 mg/dL — ABNORMAL HIGH (ref 0.3–1.2)
Total Protein: 7.6 g/dL (ref 6.5–8.1)

## 2022-03-29 LAB — MAGNESIUM: Magnesium: 1.7 mg/dL (ref 1.7–2.4)

## 2022-03-29 MED ORDER — SODIUM CHLORIDE 0.9 % IV SOLN: INTRAVENOUS | Status: DC | PRN

## 2022-03-29 MED ORDER — POTASSIUM CHLORIDE ER 10 MEQ PO TBCR: 10.0000 meq | EXTENDED_RELEASE_TABLET | Freq: Every day | ORAL | 0 refills | Status: DC

## 2022-03-29 MED ORDER — POTASSIUM CHLORIDE 10 MEQ/100ML IV SOLN
10.0000 meq | INTRAVENOUS | Status: AC
  Administered 2022-03-29 (×3): 10 meq via INTRAVENOUS
  Filled 2022-03-29 (×3): qty 100

## 2022-03-29 MED ORDER — CEPHALEXIN 500 MG PO CAPS: 500.0000 mg | ORAL_CAPSULE | Freq: Four times a day (QID) | ORAL | 0 refills | Status: AC

## 2022-03-29 MED ORDER — POTASSIUM CHLORIDE CRYS ER 20 MEQ PO TBCR
40.0000 meq | EXTENDED_RELEASE_TABLET | Freq: Once | ORAL | Status: AC
Start: 2022-03-29 — End: 2022-03-29
  Administered 2022-03-29: 40 meq via ORAL
  Filled 2022-03-29: qty 2

## 2022-03-29 NOTE — ED Notes (Signed)
Called lab to add on Mag  

## 2022-03-29 NOTE — Discharge Instructions (Addendum)
You were seen in the emergency department today for blood in your urine. It appears you have an urinary tract infection. We are placing you on antibiotics for 5 days. Your potassium was also low, which may be from the chlorthalidone medication you are on. Please follow-up with your primary care provider, as you may need to be on a potassium supplement. Additionally, please follow-up with [facility] to schedule a follow-up appointment regarding the blood in your urine. Please return to the emergency department if you begin to have abdominal pain with bloody urine or fevers.

## 2022-03-29 NOTE — ED Notes (Signed)
Assumed care of pt, he is alert and oriented at this time.  No needs identified.

## 2022-03-29 NOTE — ED Provider Notes (Signed)
Received at shift change from Timothy Blaze, PA-C please see note for full detail  In short patient with medical history including hypertension, CKD, prostate cancer status post prostatectomy presents with complaints of hematuria, started today, some had this the past, urinary urgency frequency denies any suprapubic or flank tenderness, no testicular pain or penile discharge, no fevers or chills, he is not on any anticoag's he is not having any other complaints.  Per previous provider reassessed patient after IV potassium and can discharge home   Physical Exam  BP (!) 151/78   Pulse 66   Temp 98.5 F (36.9 C) (Oral)   Resp 19   Ht 5\' 11"  (1.803 m)   Wt 124.7 kg   SpO2 92%   BMI 38.35 kg/m   Physical Exam Vitals and nursing note reviewed.  Constitutional:      General: He is not in acute distress.    Appearance: He is not ill-appearing.  HENT:     Head: Normocephalic and atraumatic.     Nose: No congestion.  Eyes:     Conjunctiva/sclera: Conjunctivae normal.  Cardiovascular:     Rate and Rhythm: Normal rate and regular rhythm.     Pulses: Normal pulses.     Heart sounds: No murmur heard.    No friction rub. No gallop.  Pulmonary:     Effort: No respiratory distress.     Breath sounds: No wheezing, rhonchi or rales.  Abdominal:     Palpations: Abdomen is soft.     Tenderness: There is no abdominal tenderness. There is no right CVA tenderness or left CVA tenderness.     Comments: Abdomen soft nondistended nontender my evaluation particularly no suprapubic pain or flank tenderness.  Skin:    General: Skin is warm and dry.  Neurological:     Mental Status: He is alert.  Psychiatric:        Mood and Affect: Mood normal.     Procedures  Procedures  ED Course / MDM    Medical Decision Making Amount and/or Complexity of Data Reviewed Labs: ordered. Radiology: ordered.  Risk Prescription drug management.      Lab Tests:  I Ordered, and personally interpreted  labs.  The pertinent results include: CBC is unremarkable, CMP shows potassium 2.7, chloride 97, calcium 8.3, total T. bili 1.3, UA shows trace leukocytes, red blood cells white blood cells few bacteria, magnesium 1.7   Imaging Studies ordered:  I ordered imaging studies including US renal  I independently visualized and interpreted imaging which showed negative acute findings I agree with the radiologist interpretation   Cardiac Monitoring:  The patient was maintained on a cardiac monitor.  I personally viewed and interpreted the cardiac monitored which showed an underlying rhythm of: N/A   Medicines ordered and prescription drug management:  I ordered medication including potassium I have reviewed the patients home medicines and have made adjustments as needed  Critical Interventions:  N/A   Reevaluation:  Reassessed resting comfortably he is agreement discharge at this time  Consultations Obtained:  N/A    Test Considered:  CT AP-suspicion for for intra-abdominal abnormality is low as he has a nonsurgical abdomen nontoxic-appearing.    Rule out Suspicion for pyelo-, kidney stone, septic stone is low as he has no flank tenderness, no difficulty with urination, no leukocytosis, he is afebrile nontachycardic.  UA is slightly abnormal but with his complaints of hematuria is possibly had the beginning of a UTI, cultures have been obtained and will  start antibiotics.  There is no the patient has a low potassium this is secondary due to his diuretics.    Dispostion and problem list  After consideration of the diagnostic results and the patients response to treatment, I feel that the patent would benefit from discharge.  Hematuria-concern for possible UTI cultures obtained, started on antibiotics will have him follow-up with urology Hypokalemia-likely secondary due to diuretics, patient was given 3 units of IV potassium,  oral potassium, he will be discharged home with  supplemental potassium follow-up with primary for reassessment.           Marcello Fennel, PA-C 03/29/22 5643    Merrily Pew, MD 03/29/22 [phone]

## 2022-03-30 LAB — URINE CULTURE: Culture: NO GROWTH

## 2022-07-20 IMAGING — DX DG ANKLE COMPLETE 3+V*R*
3 series · 3 of 3 positions shown · non-contrast
Comparison: None.

CLINICAL DATA: Ankle pain and swelling

EXAM:
RIGHT ANKLE - COMPLETE 3+ VIEW

[ankle ap]
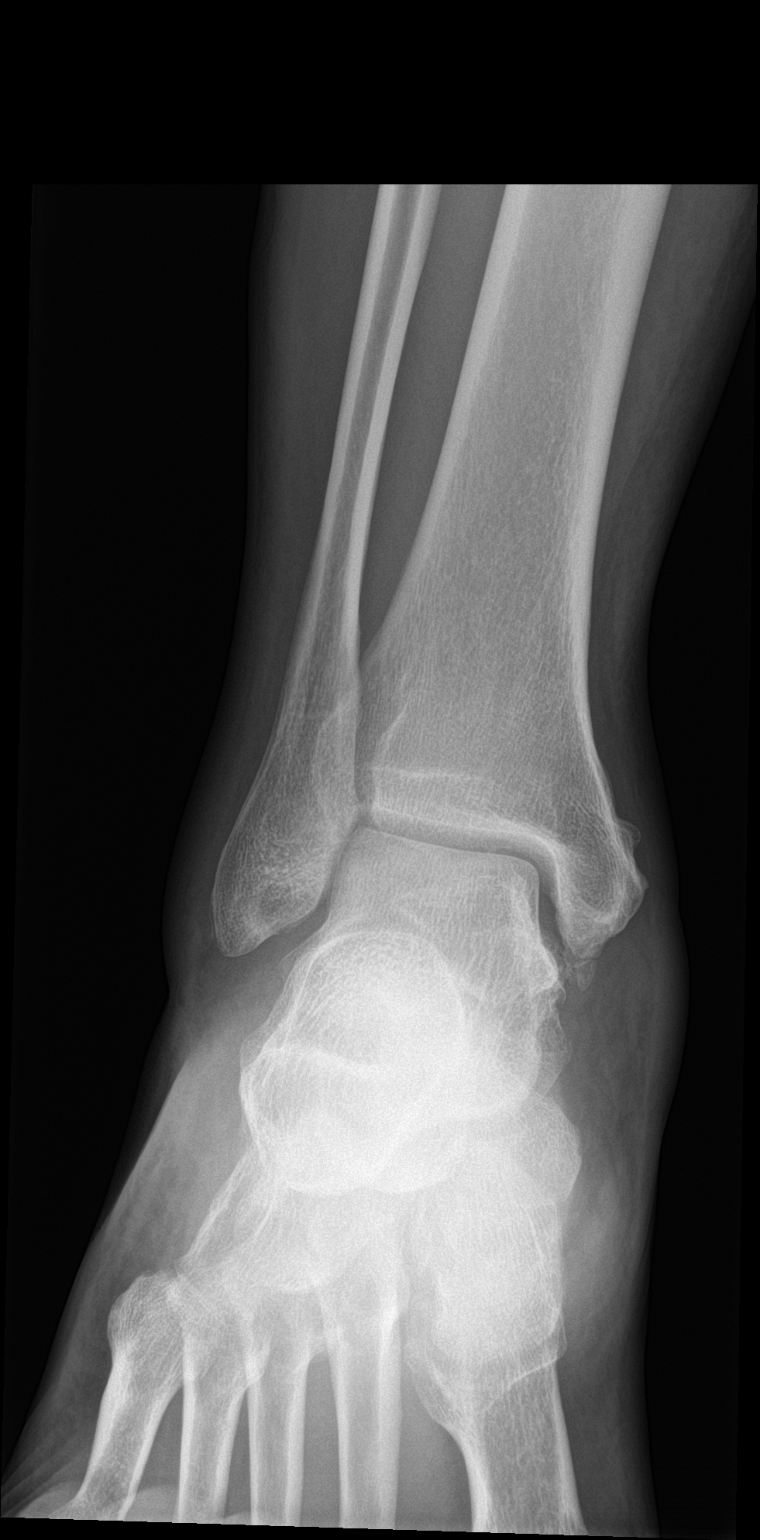

[ankle obl]
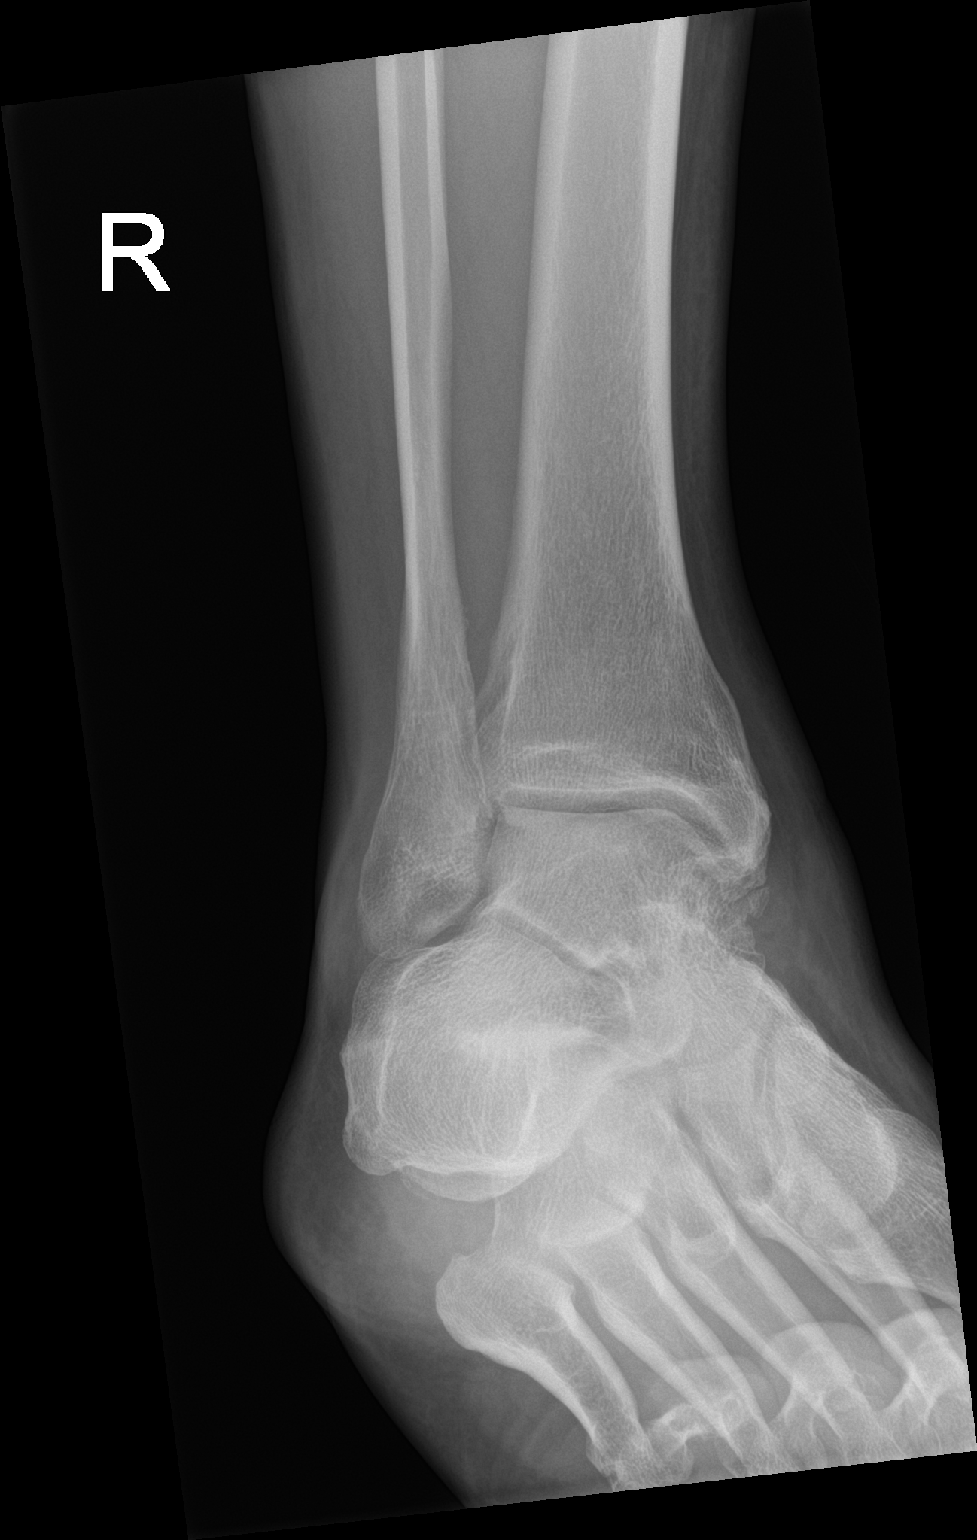

[ankle lat]
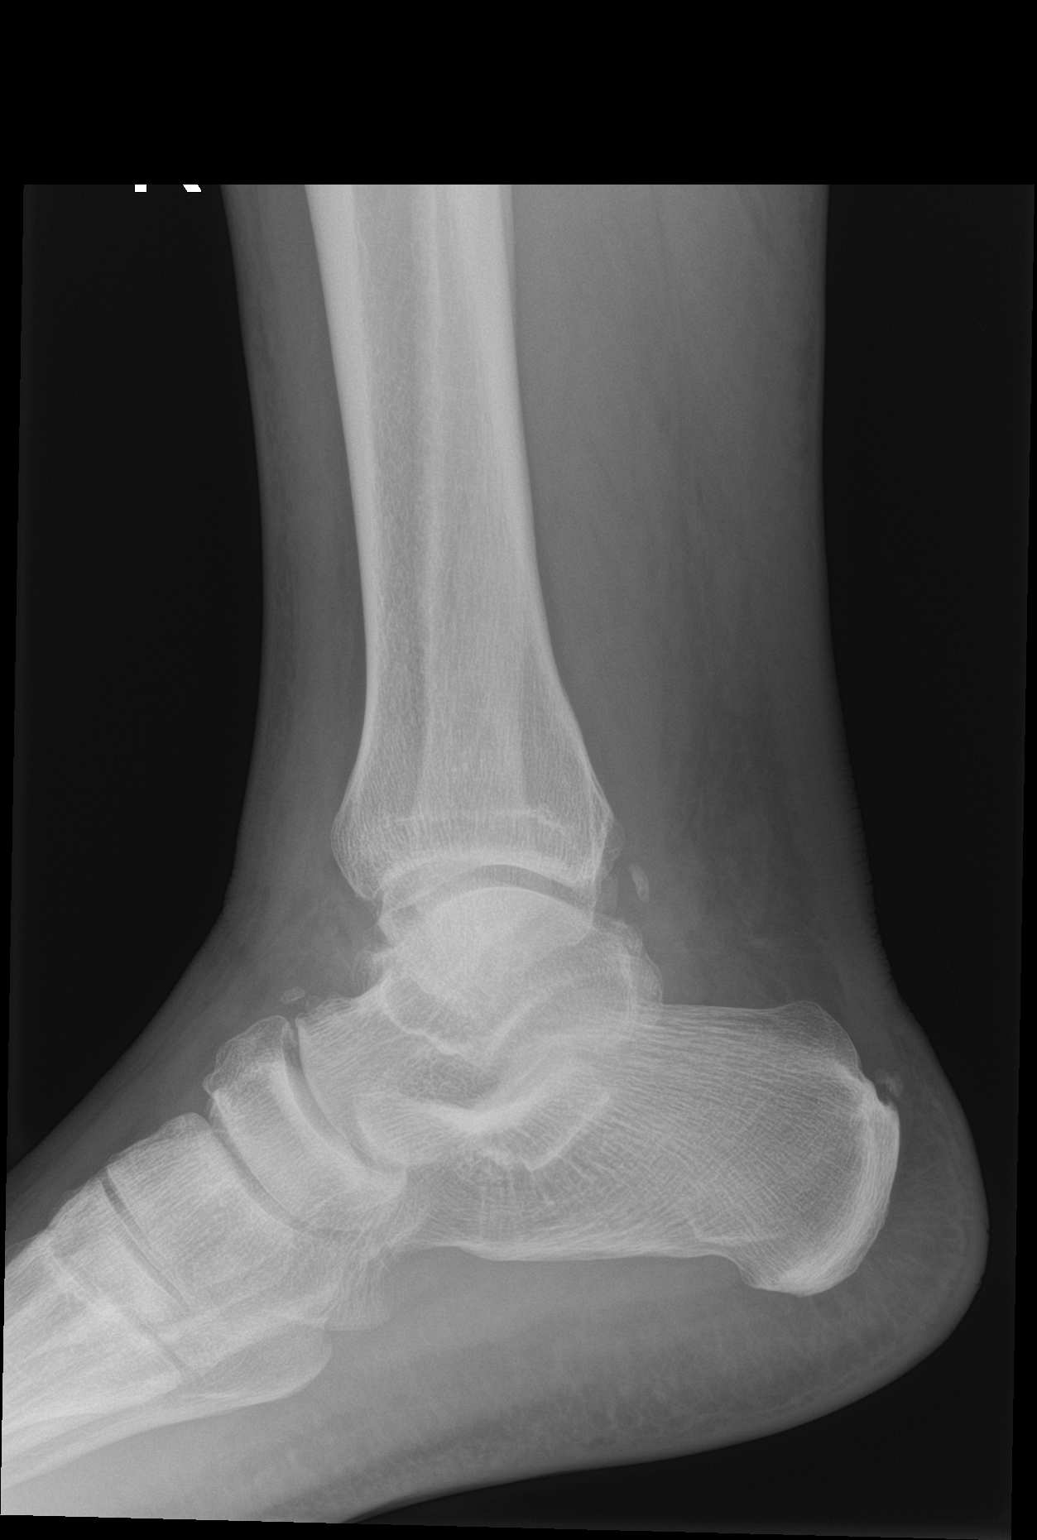

[3 of 3 positions shown; findings below may reference images not displayed]

FINDINGS: No evidence of acute fracture or dislocation. There is evidence of
old medial ligamentous injuries with corticated calcifications
inferior to the medial malleolus. Articular surfaces of the ankle
joint appear normal. Tiny bone density posterior to the ankle joint
which could be an ossicle, but a small intra-articular loose body is
not excluded. Small corticated density just superior to the
talonavicular region is probably subsequent to a distant injury.
IMPRESSION: No acute fracture. Evidence of distant medial ligamentous injury
with well corticated calcifications. Small calcification posterior
to the ankle joint on the lateral view could be an ossicle or
possibly an intra-articular loose body.

## 2023-02-06 ENCOUNTER — Other Ambulatory Visit: Payer: Self-pay

## 2023-02-06 ENCOUNTER — Emergency Department (HOSPITAL_BASED_OUTPATIENT_CLINIC_OR_DEPARTMENT_OTHER): Payer: 59

## 2023-02-06 ENCOUNTER — Emergency Department (HOSPITAL_BASED_OUTPATIENT_CLINIC_OR_DEPARTMENT_OTHER)
Admission: EM | Admit: 2023-02-06 | Discharge: 2023-02-06 | Disposition: A | Discharge status: Home / Self Care | Payer: 59 | Attending: Emergency Medicine | Admitting: Emergency Medicine

## 2023-02-06 ENCOUNTER — Encounter (HOSPITAL_BASED_OUTPATIENT_CLINIC_OR_DEPARTMENT_OTHER): Payer: Self-pay | Admitting: Emergency Medicine

## 2023-02-06 DIAGNOSIS — R918 Other nonspecific abnormal finding of lung field: Secondary | ICD-10-CM | POA: Insufficient documentation

## 2023-02-06 DIAGNOSIS — I10 Essential (primary) hypertension: Secondary | ICD-10-CM | POA: Insufficient documentation

## 2023-02-06 DIAGNOSIS — Z79899 Other long term (current) drug therapy: Secondary | ICD-10-CM | POA: Diagnosis not present

## 2023-02-06 DIAGNOSIS — E876 Hypokalemia: Secondary | ICD-10-CM | POA: Diagnosis not present

## 2023-02-06 DIAGNOSIS — R0789 Other chest pain: Secondary | ICD-10-CM

## 2023-02-06 DIAGNOSIS — R9389 Abnormal findings on diagnostic imaging of other specified body structures: Secondary | ICD-10-CM

## 2023-02-06 DIAGNOSIS — R079 Chest pain, unspecified: Secondary | ICD-10-CM | POA: Diagnosis present

## 2023-02-06 LAB — CBC WITH DIFFERENTIAL/PLATELET
Abs Immature Granulocytes: 0.02 10*3/uL (ref 0.00–0.07)
Basophils Absolute: 0.1 10*3/uL (ref 0.0–0.1)
Basophils Relative: 1 %
Eosinophils Absolute: 0.5 10*3/uL (ref 0.0–0.5)
Eosinophils Relative: 5 %
HCT: 45.3 % (ref 39.0–52.0)
Hemoglobin: 15.4 g/dL (ref 13.0–17.0)
Immature Granulocytes: 0 %
Lymphocytes Relative: 27 %
Lymphs Abs: 2.7 10*3/uL (ref 0.7–4.0)
MCH: 32 pg (ref 26.0–34.0)
MCHC: 34 g/dL (ref 30.0–36.0)
MCV: 94 fL (ref 80.0–100.0)
Monocytes Absolute: 1.2 10*3/uL — ABNORMAL HIGH (ref 0.1–1.0)
Monocytes Relative: 12 %
Neutro Abs: 5.5 10*3/uL (ref 1.7–7.7)
Neutrophils Relative %: 55 %
Platelets: 328 10*3/uL (ref 150–400)
RBC: 4.82 MIL/uL (ref 4.22–5.81)
RDW: 11.7 % (ref 11.5–15.5)
WBC: 9.9 10*3/uL (ref 4.0–10.5)
nRBC: 0 % (ref 0.0–0.2)

## 2023-02-06 LAB — BASIC METABOLIC PANEL
Anion gap: 12 (ref 5–15)
BUN: 12 mg/dL (ref 6–20)
CO2: 26 mmol/L (ref 22–32)
Calcium: 8.8 mg/dL — ABNORMAL LOW (ref 8.9–10.3)
Chloride: 96 mmol/L — ABNORMAL LOW (ref 98–111)
Creatinine, Ser: 1.27 mg/dL — ABNORMAL HIGH (ref 0.61–1.24)
GFR, Estimated: >60 mL/min (ref >60)
Glucose, Bld: 126 mg/dL — ABNORMAL HIGH (ref 70–99)
Potassium: 2.8 mmol/L — ABNORMAL LOW (ref 3.5–5.1)
Sodium: 134 mmol/L — ABNORMAL LOW (ref 135–145)

## 2023-02-06 LAB — MAGNESIUM: Magnesium: 1.8 mg/dL (ref 1.7–2.4)

## 2023-02-06 LAB — TROPONIN I (HIGH SENSITIVITY)
Troponin I (High Sensitivity): 4 ng/L (ref <18)
Troponin I (High Sensitivity): 4 ng/L (ref <18)

## 2023-02-06 MED ORDER — POTASSIUM CHLORIDE ER 20 MEQ PO TBCR: 20.0000 meq | EXTENDED_RELEASE_TABLET | Freq: Two times a day (BID) | ORAL | 0 refills | Status: AC

## 2023-02-06 MED ORDER — POTASSIUM CHLORIDE CRYS ER 20 MEQ PO TBCR
40.0000 meq | EXTENDED_RELEASE_TABLET | Freq: Once | ORAL | Status: AC
  Administered 2023-02-06: 40 meq via ORAL
  Filled 2023-02-06: qty 2

## 2023-02-06 NOTE — ED Provider Notes (Signed)
[facility]  Provider Note  CSN: [id number] Arrival date & time: 02/06/23 2021  History Chief Complaint  Patient presents with   Chest Pain    Timothy Rosario is a 52 y.o. male with history of HTN reports intermittent episodes of fleeting, cramping L upper chest pain onset around noon today, not radiating, not associated with cough, fever or SOB. No prior CAD. No family history of premature CAD. Does not smoke.    Home Medications Prior to Admission medications   Medication Sig Start Date End Date Taking? Authorizing Provider  amLODipine (NORVASC) 10 MG tablet Take 1 tablet (10 mg total) by mouth daily. 03/23/19   Melene Plan, DO  chlorthalidone (HYGROTON) 50 MG tablet Take 50 mg by mouth daily. 02/12/21   [provider]  LORazepam (ATIVAN) 1 MG tablet Take 3 tablets PO on day one.  Take 2 tablets PO on day 2.  Take one tablet on day 3 then STOP 03/25/19   Zadie Rhine, MD  potassium chloride 20 MEQ TBCR Take 1 tablet (20 mEq total) by mouth 2 (two) times daily for 10 days. 02/06/23 02/16/23  Pollyann Savoy, MD  rosuvastatin (CRESTOR) 10 MG tablet Take 10 mg by mouth at bedtime. 03/28/21   [provider]  lisinopril (ZESTRIL) 10 MG tablet Take 1 tablet (10 mg total) by mouth daily. 03/23/19 03/23/19  Melene Plan, DO     Allergies    Patient has no known allergies.   Review of Systems   Review of Systems Please see HPI for pertinent positives and negatives  Physical Exam BP (!) 164/85 (BP Location: Right Arm)   Pulse (!) 103   Temp 98 F (36.7 C)   Resp 18   Ht 5\' 11"  (1.803 m)   Wt 122.5 kg   SpO2 94%   BMI 37.66 kg/m   Physical Exam Vitals and nursing note reviewed.  Constitutional:      Appearance: Normal appearance.  HENT:     Head: Normocephalic and atraumatic.     Nose: Nose normal.     Mouth/Throat:     Mouth: Mucous membranes are moist.  Eyes:     Extraocular Movements: Extraocular  movements intact.     Conjunctiva/sclera: Conjunctivae normal.  Cardiovascular:     Rate and Rhythm: Normal rate.  Pulmonary:     Effort: Pulmonary effort is normal.     Breath sounds: Normal breath sounds.  Chest:     Chest wall: No tenderness.  Abdominal:     General: Abdomen is flat.     Palpations: Abdomen is soft.     Tenderness: There is no abdominal tenderness.  Musculoskeletal:        General: No swelling. Normal range of motion.     Cervical back: Neck supple.  Skin:    General: Skin is warm and dry.  Neurological:     General: No focal deficit present.     Mental Status: He is alert.  Psychiatric:        Mood and Affect: Mood normal.     ED Results / Procedures / Treatments   EKG EKG Interpretation Date/Time:  Tuesday February 06 2023 20:29:49 EST Ventricular Rate:  103 PR Interval:  178 QRS Duration:  86 QT Interval:  338 QTC Calculation: 442 R Axis:   33  Text Interpretation: Sinus tachycardia Cannot rule out Anterior infarct , age undetermined Abnormal ECG When compared with ECG of 25-Mar-2019 01:45, PREVIOUS  ECG IS PRESENT Since last tracing Rate faster Confirmed by Susy Frizzle [phone]) on 02/06/2023 11:02:59 PM  Procedures Procedures  Medications Ordered in the ED Medications  potassium chloride SA (KLOR-CON M) CR tablet 40 mEq (40 mEq Oral Given 02/06/23 2341)    Initial Impression and Plan  Patient here with atypical non exertional chest pain for the last several hours. Exam is reassuring. Labs done in triage show normal CBC, BMP with CKD and hypokalemia is similar to previous. Trop is neg x 2. CXR is pending.   ED Course   Clinical Course as of 02/06/23 2343  Tue Feb 06, 2023  2329 I personally viewed the images from radiology studies and agree with radiologist interpretation: CXR with chronic appearing changes, he is not currently having any acute respiratory symptoms and does not report any chronic or previous respiratory problems. Will  need outpatient evaluation.  He has low risk for ACS, HEART is 2. Plan discharge with PCP follow up, RTED for any other concerns.   [CS]    Clinical Course User Index [CS] Pollyann Savoy, MD     MDM Rules/Calculators/A&P Medical Decision Making Problems Addressed: Abnormal chest x-ray: chronic illness or injury Atypical chest pain: acute illness or injury Hypokalemia: chronic illness or injury  Amount and/or Complexity of Data Reviewed Labs: ordered. Decision-making details documented in ED Course. Radiology: ordered and independent interpretation performed. Decision-making details documented in ED Course. ECG/medicine tests: ordered and independent interpretation performed. Decision-making details documented in ED Course.  Risk Prescription drug management.     Final Clinical Impression(s) / ED Diagnoses Final diagnoses:  Atypical chest pain  Hypokalemia  Abnormal chest x-ray    Rx / DC Orders ED Discharge Orders          Ordered    potassium chloride 20 MEQ TBCR  2 times daily        02/06/23 2341             Pollyann Savoy, MD 02/06/23 [phone]

## 2023-02-06 NOTE — ED Triage Notes (Signed)
Pt c/o LT CP since 12pm; describes as tight/cramping; denies other sx
